# Patient Record
Sex: Male | Born: 1950 | Race: White | Hispanic: No | Marital: Married | Smoking: Former smoker
Health system: Southern US, Community
[De-identification: ages and names within clinical notes are randomized; demographics above are authoritative.]

## PROBLEM LIST (undated history)

## (undated) DIAGNOSIS — I469 Cardiac arrest, cause unspecified: Secondary | ICD-10-CM

## (undated) DIAGNOSIS — C439 Malignant melanoma of skin, unspecified: Secondary | ICD-10-CM

## (undated) DIAGNOSIS — E119 Type 2 diabetes mellitus without complications: Secondary | ICD-10-CM

## (undated) DIAGNOSIS — F32A Depression, unspecified: Secondary | ICD-10-CM

## (undated) DIAGNOSIS — I10 Essential (primary) hypertension: Secondary | ICD-10-CM

## (undated) DIAGNOSIS — E7800 Pure hypercholesterolemia, unspecified: Secondary | ICD-10-CM

## (undated) DIAGNOSIS — I2699 Other pulmonary embolism without acute cor pulmonale: Secondary | ICD-10-CM

## (undated) HISTORY — PX: BACK SURGERY: SHX140

## (undated) HISTORY — PX: KNEE SURGERY: SHX244

## (undated) HISTORY — PX: SHOULDER SURGERY: SHX246

## (undated) HISTORY — PX: PULMONARY EMBOLISM SURGERY: SHX752

---

## 2009-12-27 ENCOUNTER — Ambulatory Visit: Payer: Self-pay | Admitting: Internal Medicine

## 2009-12-27 ENCOUNTER — Inpatient Hospital Stay (HOSPITAL_COMMUNITY): Admission: EM | Admit: 2009-12-27 | Discharge: 2009-12-31 | Payer: Self-pay | Admitting: Emergency Medicine

## 2010-02-12 ENCOUNTER — Encounter (INDEPENDENT_AMBULATORY_CARE_PROVIDER_SITE_OTHER): Payer: Self-pay | Admitting: *Deleted

## 2010-02-12 ENCOUNTER — Encounter: Payer: Self-pay | Admitting: Internal Medicine

## 2010-03-17 ENCOUNTER — Encounter (INDEPENDENT_AMBULATORY_CARE_PROVIDER_SITE_OTHER): Payer: Self-pay | Admitting: *Deleted

## 2010-03-27 ENCOUNTER — Telehealth (INDEPENDENT_AMBULATORY_CARE_PROVIDER_SITE_OTHER): Payer: Self-pay | Admitting: *Deleted

## 2010-10-06 NOTE — Letter (Signed)
Summary: Device-Delinquent Check  [facility], Main Office  [address]   [place], [place] [address]   Phone: [phone]  Fax: [phone]     February 12, 2010 MRN: [id number]   ZIERE DOCKEN [address] [place], [place]  [address]   Dear Mr. PALLONE,  According to our records, you have not had your implanted device checked in the recommended period of time.  We are unable to determine appropriate device function without checking your device on a regular basis.  Please call our office to schedule an appointment with Dr. Ladona Ridgel as soon as possible.  If you are having your device checked by another physician, please call us so that we may update our records.  Thank you,  Altha Harm, LPN  February 13, 2724 8:42 AM  [facility]

## 2010-10-06 NOTE — Letter (Signed)
Summary: Device-Delinquent Check  [facility], Main Office  [address]   [place], [place] [address]   Phone: [phone]  Fax: [phone]     March 17, 2010 MRN: [id number]   SHAE AUGELLO [address] [place], [place]  [address]   Dear Mr. SHOCK,  According to our records, you have not had your implanted device checked in the recommended period of time.  We are unable to determine appropriate device function without checking your device on a regular basis.  Please call our office to schedule an appointment as soon as possible.  If you are having your device checked by another physician, please call us so that we may update our records.  Thank you,  Altha Harm, LPN  March 17, 2010 10:35 AM  [facility]  2nd notice

## 2010-10-06 NOTE — Progress Notes (Signed)
  Phone Note Call from Patient   Caller: Spouse Summary of Call: pt's wife calling re past due letter for pacemaker-pt is being followed by another cardiologist Initial call taken by: Glynda Jaeger,  March 27, 2010 10:59 AM

## 2010-10-06 NOTE — Miscellaneous (Signed)
Summary: Device preload  Clinical Lists Changes  Observations: Added new observation of ICD INDICATN: VR arrest (02/12/2010 14:58) Added new observation of ICDLEADSTAT1: active (02/12/2010 14:58) Added new observation of ICDLEADSER1: [id number] (02/12/2010 14:58) Added new observation of ICDLEADMOD1: 3875  (02/12/2010 14:58) Added new observation of ICDLEADDOI1: 12/30/2009  (02/12/2010 14:58) Added new observation of ICDLEADLOC1: RV  (02/12/2010 14:58) Added new observation of ICD IMP MD: Lewayne Bunting, MD  (02/12/2010 14:58) Added new observation of ICD IMPL DTE: 12/30/2009  (02/12/2010 14:58) Added new observation of ICD SERL#: [id number] H  (02/12/2010 14:58) Added new observation of ICD MODL#: [id number]  (02/12/2010 14:58) Added new observation of ICDMANUFACTR: [employer]  (02/12/2010 14:58) Added new observation of ICD MD: Lewayne Bunting, MD  (02/12/2010 14:58)       ICD Specifications Following MD:  Lewayne Bunting, MD     ICD Vendor:  [employer]     ICD Model Number:  D264VRC     ICD Serial Number:  [id number] ICD DOI:  12/30/2009     ICD Implanting MD:  Lewayne Bunting, MD  Lead 1:    Location: RV     DOI: 12/30/2009     Model #: 1601     Serial #: [id number]     Status: active  Indications::  VR arrest

## 2010-11-24 LAB — BASIC METABOLIC PANEL
BUN: 12 mg/dL (ref 6–23)
BUN: 6 mg/dL (ref 6–23)
BUN: 9 mg/dL (ref 6–23)
CO2: 25 mEq/L (ref 19–32)
Chloride: 107 mEq/L (ref 96–112)
Chloride: 108 mEq/L (ref 96–112)
Chloride: 110 mEq/L (ref 96–112)
Creatinine, Ser: 0.84 mg/dL (ref 0.4–1.5)
Creatinine, Ser: 0.85 mg/dL (ref 0.4–1.5)
GFR calc Af Amer: >60 mL/min (ref >60)
GFR calc Af Amer: >60 mL/min (ref >60)
GFR calc non Af Amer: >60 mL/min (ref >60)
Potassium: 3.8 mEq/L (ref 3.5–5.1)
Sodium: 139 mEq/L (ref 135–145)

## 2010-11-24 LAB — COMPREHENSIVE METABOLIC PANEL
ALT: 118 U/L — ABNORMAL HIGH (ref 0–53)
ALT: 241 U/L — ABNORMAL HIGH (ref 0–53)
AST: 272 U/L — ABNORMAL HIGH (ref 0–37)
AST: 58 U/L — ABNORMAL HIGH (ref 0–37)
Albumin: 3.4 g/dL — ABNORMAL LOW (ref 3.5–5.2)
Albumin: 4 g/dL (ref 3.5–5.2)
Alkaline Phosphatase: 47 U/L (ref 39–117)
BUN: 14 mg/dL (ref 6–23)
GFR calc non Af Amer: 53 mL/min — ABNORMAL LOW (ref >60)
Potassium: 3.9 mEq/L (ref 3.5–5.1)
Potassium: 4 mEq/L (ref 3.5–5.1)
Sodium: 139 mEq/L (ref 135–145)
Total Bilirubin: 0.8 mg/dL (ref 0.3–1.2)
Total Protein: 5.5 g/dL — ABNORMAL LOW (ref 6.0–8.3)

## 2010-11-24 LAB — GLUCOSE, CAPILLARY: Glucose-Capillary: 114 mg/dL — ABNORMAL HIGH (ref 70–99)

## 2010-11-24 LAB — CK TOTAL AND CKMB (NOT AT ARMC)
CK, MB: 17.8 ng/mL (ref 0.3–4.0)
Total CK: 1004 U/L — ABNORMAL HIGH (ref 7–232)

## 2010-11-24 LAB — DIFFERENTIAL
Basophils Absolute: 0 10*3/uL (ref 0.0–0.1)
Basophils Absolute: 0 10*3/uL (ref 0.0–0.1)
Basophils Absolute: 0.1 10*3/uL (ref 0.0–0.1)
Eosinophils Relative: 1 % (ref 0–5)
Eosinophils Relative: 2 % (ref 0–5)
Lymphocytes Relative: 21 % (ref 12–46)
Lymphocytes Relative: 26 % (ref 12–46)
Lymphs Abs: 1.6 10*3/uL (ref 0.7–4.0)
Lymphs Abs: 2.6 10*3/uL (ref 0.7–4.0)
Monocytes Absolute: 0.7 10*3/uL (ref 0.1–1.0)
Monocytes Relative: 8 % (ref 3–12)
Neutro Abs: 4.3 10*3/uL (ref 1.7–7.7)
Neutro Abs: 5 10*3/uL (ref 1.7–7.7)
Neutrophils Relative %: 68 % (ref 43–77)

## 2010-11-24 LAB — CBC
Hemoglobin: 13.3 g/dL (ref 13.0–17.0)
MCHC: 34.6 g/dL (ref 30.0–36.0)
MCHC: 35 g/dL (ref 30.0–36.0)
MCV: 94.8 fL (ref 78.0–100.0)
MCV: 96 fL (ref 78.0–100.0)
Platelets: 178 10*3/uL (ref 150–400)
Platelets: 199 10*3/uL (ref 150–400)
Platelets: 240 10*3/uL (ref 150–400)
RBC: 4.24 MIL/uL (ref 4.22–5.81)
RBC: 4.5 MIL/uL (ref 4.22–5.81)
RDW: 13.2 % (ref 11.5–15.5)
RDW: 13.3 % (ref 11.5–15.5)
RDW: 13.7 % (ref 11.5–15.5)
WBC: 6.9 10*3/uL (ref 4.0–10.5)
WBC: 7.4 10*3/uL (ref 4.0–10.5)
WBC: 8.6 10*3/uL (ref 4.0–10.5)

## 2010-11-24 LAB — HEPARIN LEVEL (UNFRACTIONATED)
Heparin Unfractionated: 0.13 IU/mL — ABNORMAL LOW (ref 0.30–0.70)
Heparin Unfractionated: 0.21 IU/mL — ABNORMAL LOW (ref 0.30–0.70)
Heparin Unfractionated: 0.23 IU/mL — ABNORMAL LOW (ref 0.30–0.70)
Heparin Unfractionated: 0.64 IU/mL (ref 0.30–0.70)

## 2010-11-24 LAB — HEPATIC FUNCTION PANEL
AST: 122 U/L — ABNORMAL HIGH (ref 0–37)
Albumin: 3.4 g/dL — ABNORMAL LOW (ref 3.5–5.2)
Alkaline Phosphatase: 49 U/L (ref 39–117)
Total Protein: 5.6 g/dL — ABNORMAL LOW (ref 6.0–8.3)

## 2010-11-24 LAB — MRSA PCR SCREENING: MRSA by PCR: NEGATIVE

## 2010-11-24 LAB — APTT: aPTT: 23 seconds — ABNORMAL LOW (ref 24–37)

## 2010-11-24 LAB — TYPE AND SCREEN: ABO/RH(D): AB POS

## 2010-11-24 LAB — RAPID URINE DRUG SCREEN, HOSP PERFORMED
Amphetamines: NOT DETECTED
Barbiturates: NOT DETECTED
Cocaine: NOT DETECTED
Tetrahydrocannabinol: NOT DETECTED

## 2010-11-24 LAB — POCT I-STAT, CHEM 8
Calcium, Ion: 1.02 mmol/L — ABNORMAL LOW (ref 1.12–1.32)
Creatinine, Ser: 1.3 mg/dL (ref 0.4–1.5)
Glucose, Bld: 185 mg/dL — ABNORMAL HIGH (ref 70–99)
Potassium: 5.7 mEq/L — ABNORMAL HIGH (ref 3.5–5.1)
Sodium: 137 mEq/L (ref 135–145)

## 2010-11-24 LAB — CARDIAC PANEL(CRET KIN+CKTOT+MB+TROPI)
Relative Index: 1 (ref 0.0–2.5)
Relative Index: 1.1 (ref 0.0–2.5)
Total CK: 1216 U/L — ABNORMAL HIGH (ref 7–232)
Total CK: 1559 U/L — ABNORMAL HIGH (ref 7–232)
Troponin I: 0.17 ng/mL — ABNORMAL HIGH (ref 0.00–0.06)
Troponin I: 0.32 ng/mL — ABNORMAL HIGH (ref 0.00–0.06)

## 2010-11-24 LAB — POCT CARDIAC MARKERS
Myoglobin, poc: 308 ng/mL (ref 12–200)
Troponin i, poc: <0.05 ng/mL (ref 0.00–0.09)

## 2010-11-24 LAB — PROTIME-INR: Prothrombin Time: 18.9 seconds — ABNORMAL HIGH (ref 11.6–15.2)

## 2010-11-24 LAB — HEMOGLOBIN A1C: Mean Plasma Glucose: 117 mg/dL — ABNORMAL HIGH (ref <117)

## 2010-11-24 LAB — LIPID PANEL: HDL: 31 mg/dL — ABNORMAL LOW (ref >39)

## 2010-11-24 LAB — ABO/RH: ABO/RH(D): AB POS

## 2010-11-24 LAB — MAGNESIUM: Magnesium: 2.5 mg/dL (ref 1.5–2.5)

## 2010-11-24 LAB — TSH: TSH: 1.269 u[IU]/mL (ref 0.350–4.500)

## 2011-04-20 ENCOUNTER — Encounter: Payer: Self-pay | Admitting: *Deleted

## 2011-11-12 IMAGING — CT CT CERVICAL SPINE W/O CM
5 of 6 series · 14 of 33 positions shown, 16 images · non-contrast
Comparison: None

CT HEAD

CLINICAL DATA: Syncope and fall, with laceration to the occiput.

CT HEAD WITHOUT CONTRAST AND CT CERVICAL SPINE WITHOUT CONTRAST
TECHNIQUE: Multidetector CT imaging of the head and cervical spine
was performed following the standard protocol without intravenous
contrast.  Multiplanar CT image reconstructions of the cervical
spine were also generated.

[Series 5: c_spine 2.0 b31s detail · axial · 0.25mm/px · z∈[+1010,+1064]mm · 2 of 83 slices shown, 3 images]
[im 28/83  soft-tissue]
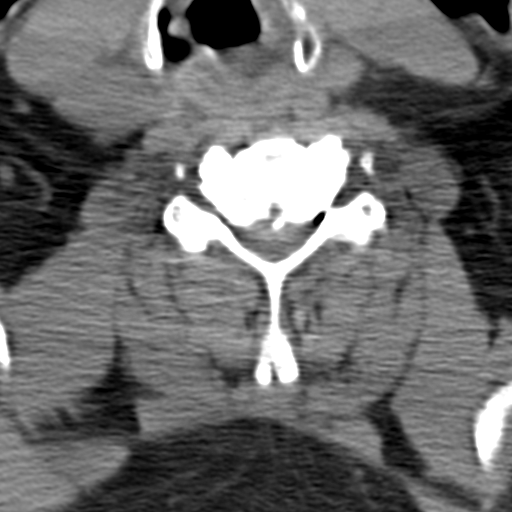
[im 28/83  bone]
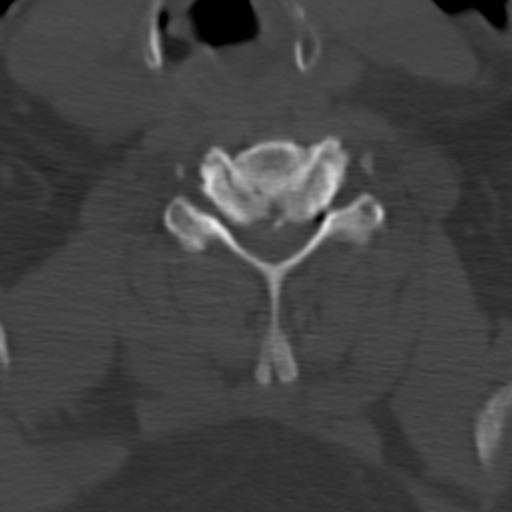
[im 55/83  bone]
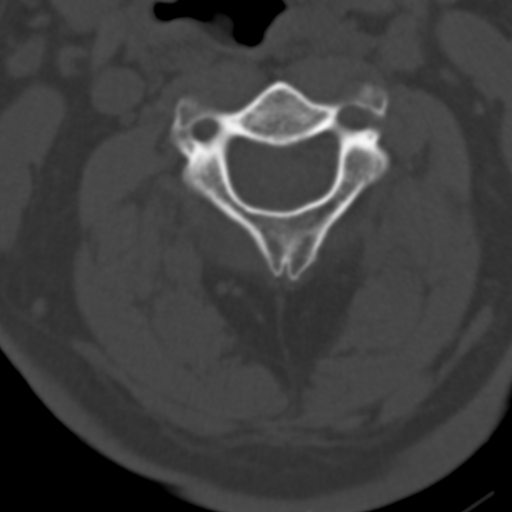

[Series 7: head trauma 2.4 h60s · axial · 0.41mm/px · z∈[+1178,+1237]mm · 2 of 72 slices shown]
[im 24/72  bone]
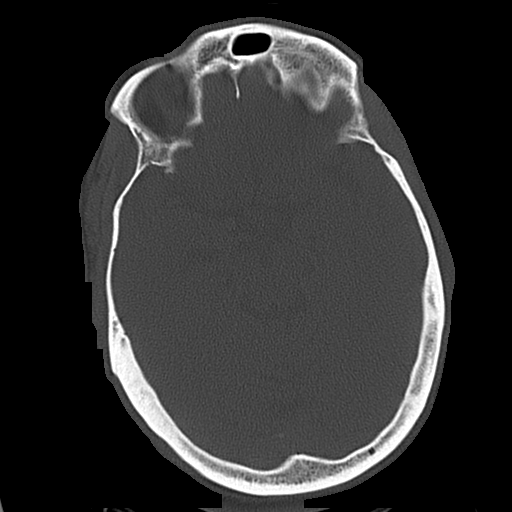
[im 48/72  bone]
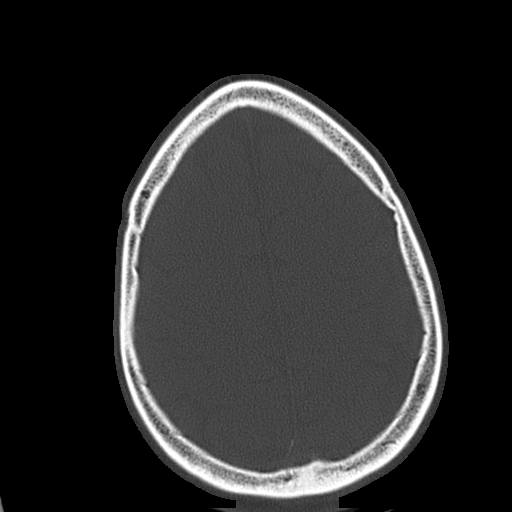

[Series 602: <mpr thick range> · coronal · 0.32mm/px · 3 of 38 slices shown]
[im 8/38  bone]
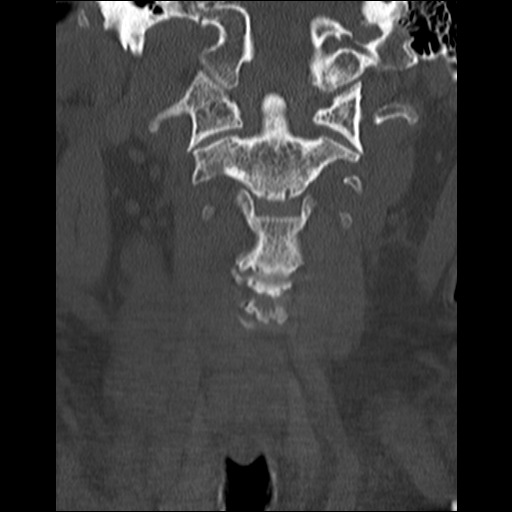
[im 15/38  bone]
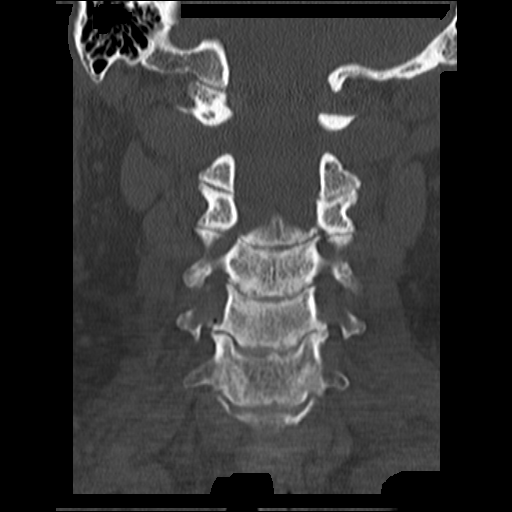
[im 23/38  bone]
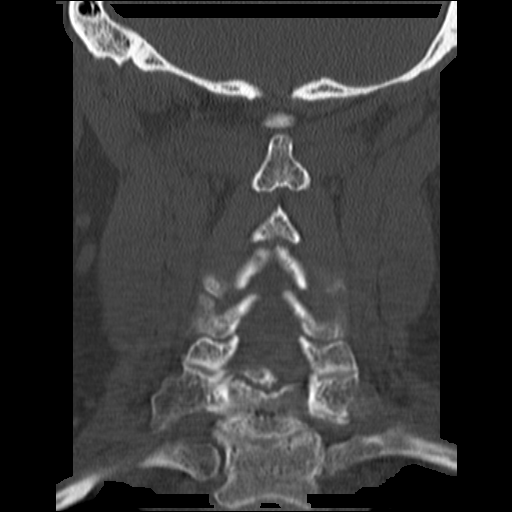

[Series 603: <mpr thick range(1)> · sagittal · 0.32mm/px · 5 of 38 slices shown, 6 images]
[im 13/38  bone]
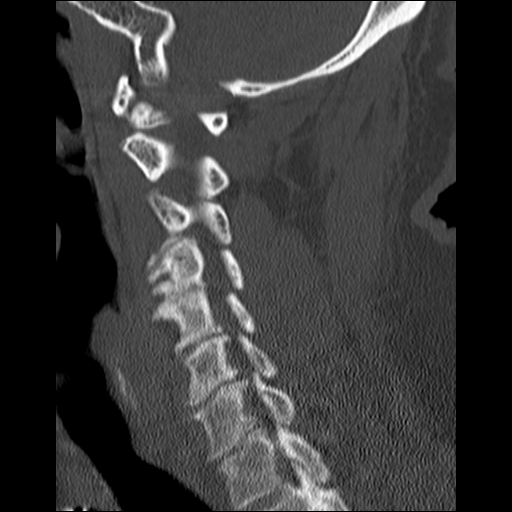
[im 16/38  bone]
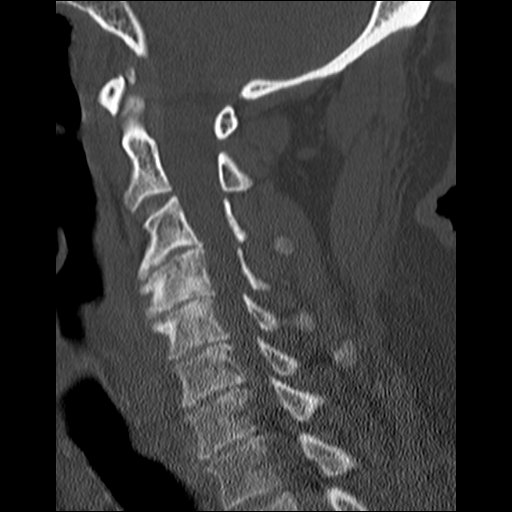
[im 19/38  soft-tissue]
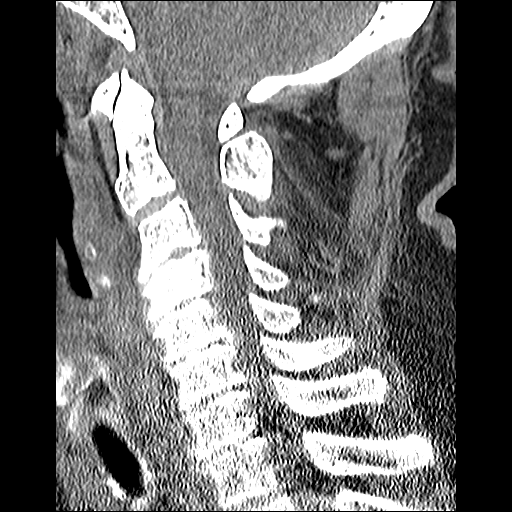
[im 19/38  bone]
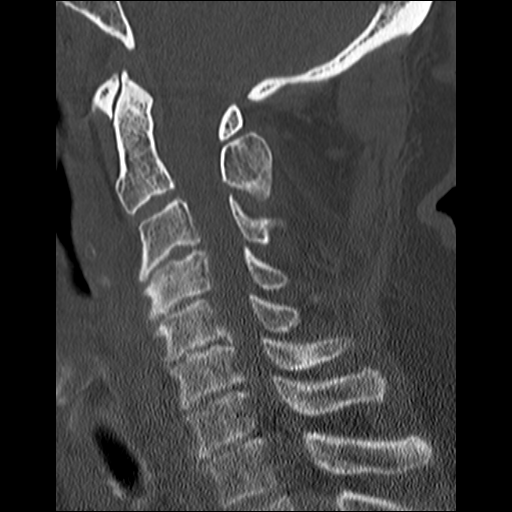
[im 22/38  bone]
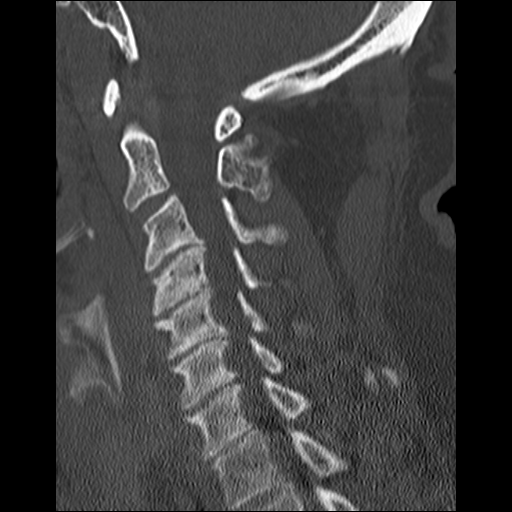
[im 25/38  bone]
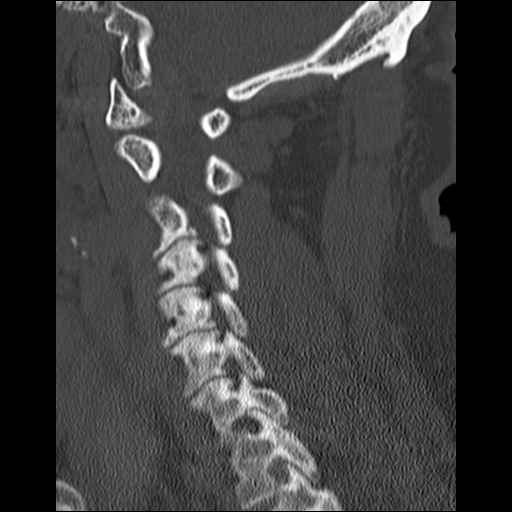

[Series 604: <mpr thick range(2)> · axial · 0.32mm/px · z∈[+990,+1032]mm · 2 of 68 slices shown]
[im 23/68  bone]
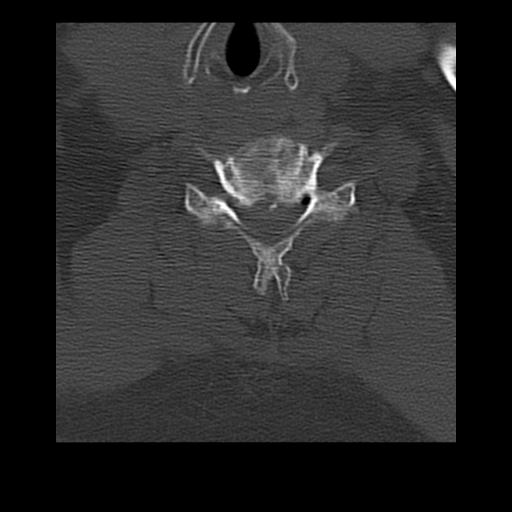
[im 45/68  bone]
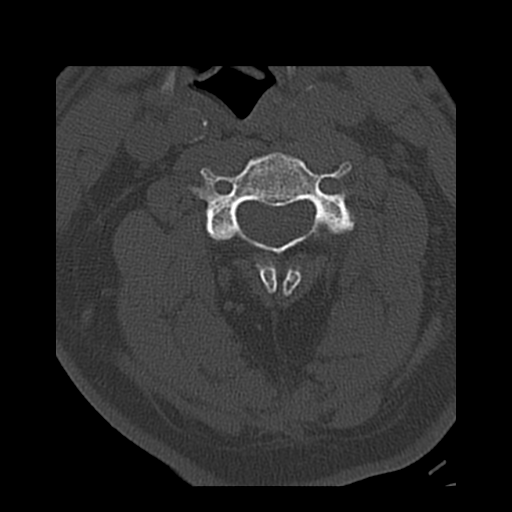

[14 of 33 positions shown; findings below may reference images not displayed]

FINDINGS: There is no evidence of acute infarction, mass lesion, or
intra- or extra-axial hemorrhage on CT.

Scattered subcortical white matter change may reflect small vessel
ischemic microangiopathy.

The posterior fossa, including the cerebellum, brainstem and fourth
ventricle, is within normal limits.  The third and lateral
ventricles, and basal ganglia are unremarkable in appearance.  The
cerebral hemispheres are symmetric in appearance, with normal gray-
white differentiation.  No mass effect or midline shift is seen.

There is no evidence of fracture; visualized osseous structures are
unremarkable in appearance.  The orbits are within normal limits.
There is partial opacification of both maxillary sinuses, left
greater than right, and mild partial opacification within the
ethmoid air cells.  The remaining paranasal sinuses and mastoid air
cells are well-aerated.  Mild soft tissue stranding is noted
overlying the posterior parietal calvarium.
IMPRESSION: 1.  No evidence of traumatic intracranial injury or fracture.
2.  Mild soft tissue stranding overlying the posterior parietal
calvarium.
3.  Scattered mild small vessel ischemic microangiopathy.
3.  Partial opacification of both maxillary sinuses, left greater
than right.

CT CERVICAL SPINE
FINDINGS: There is no evidence of fracture or subluxation.
Vertebral bodies demonstrate normal height and alignment.  There is
diffuse narrowing of the intervertebral disc spaces along the
cervical spine, with associated degenerative change and anterior
osteophyte formation.  Several small posterior disc osteophyte
complexes are also seen.  Prevertebral soft tissues are within
normal limits.

The thyroid gland is unremarkable in appearance.  The visualized
lung apices are clear.  No significant soft tissue abnormalities
are seen.
IMPRESSION: 1.  No evidence of fracture or subluxation along the cervical
spine.
2.  Degenerative change noted along the cervical spine.

## 2011-11-16 IMAGING — CR DG CHEST 2V
2 series · 2 of 2 positions shown · non-contrast
Comparison: 12/27/2009

CLINICAL DATA: Post AICD placement

CHEST - 2 VIEW

[w chest pa]
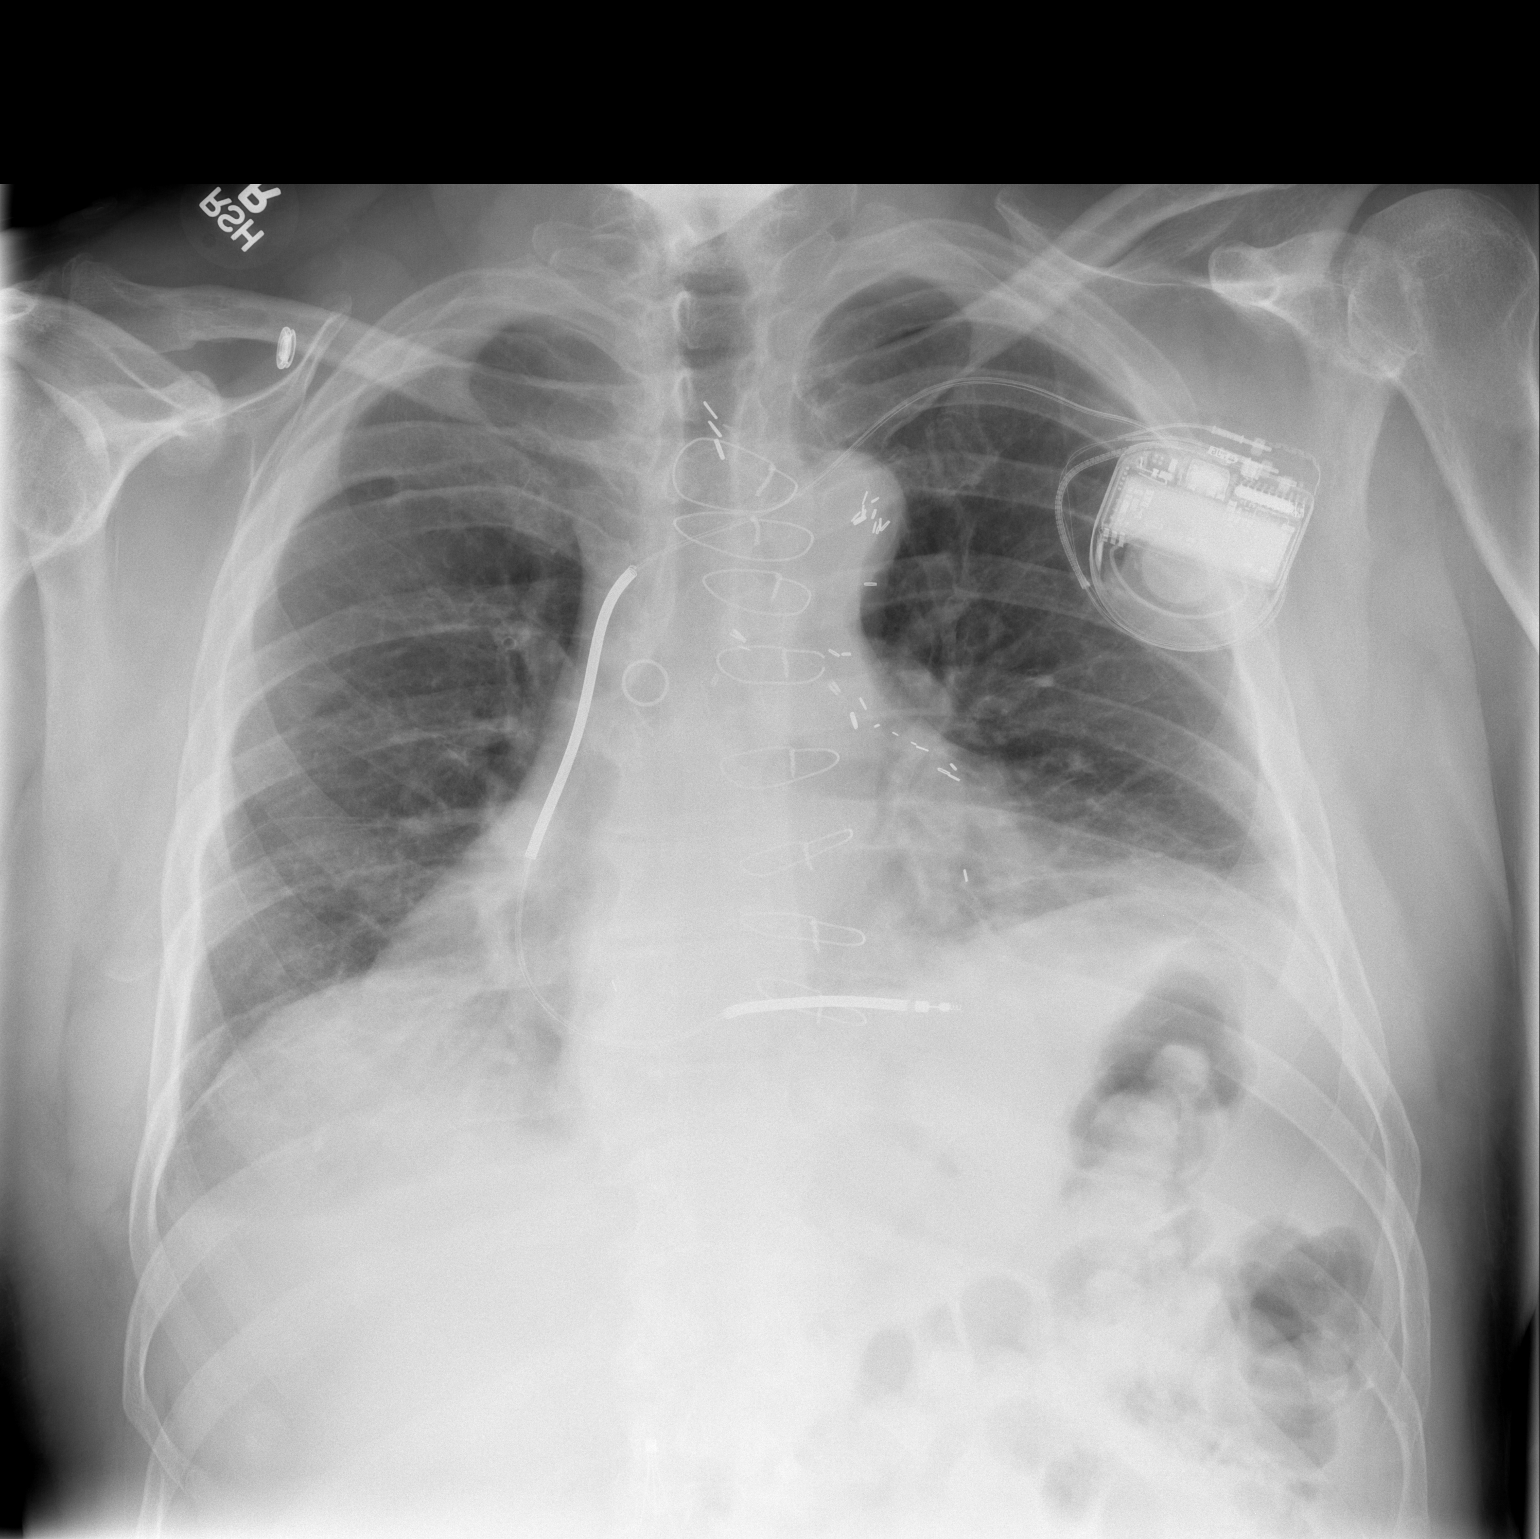

[w chest lat]
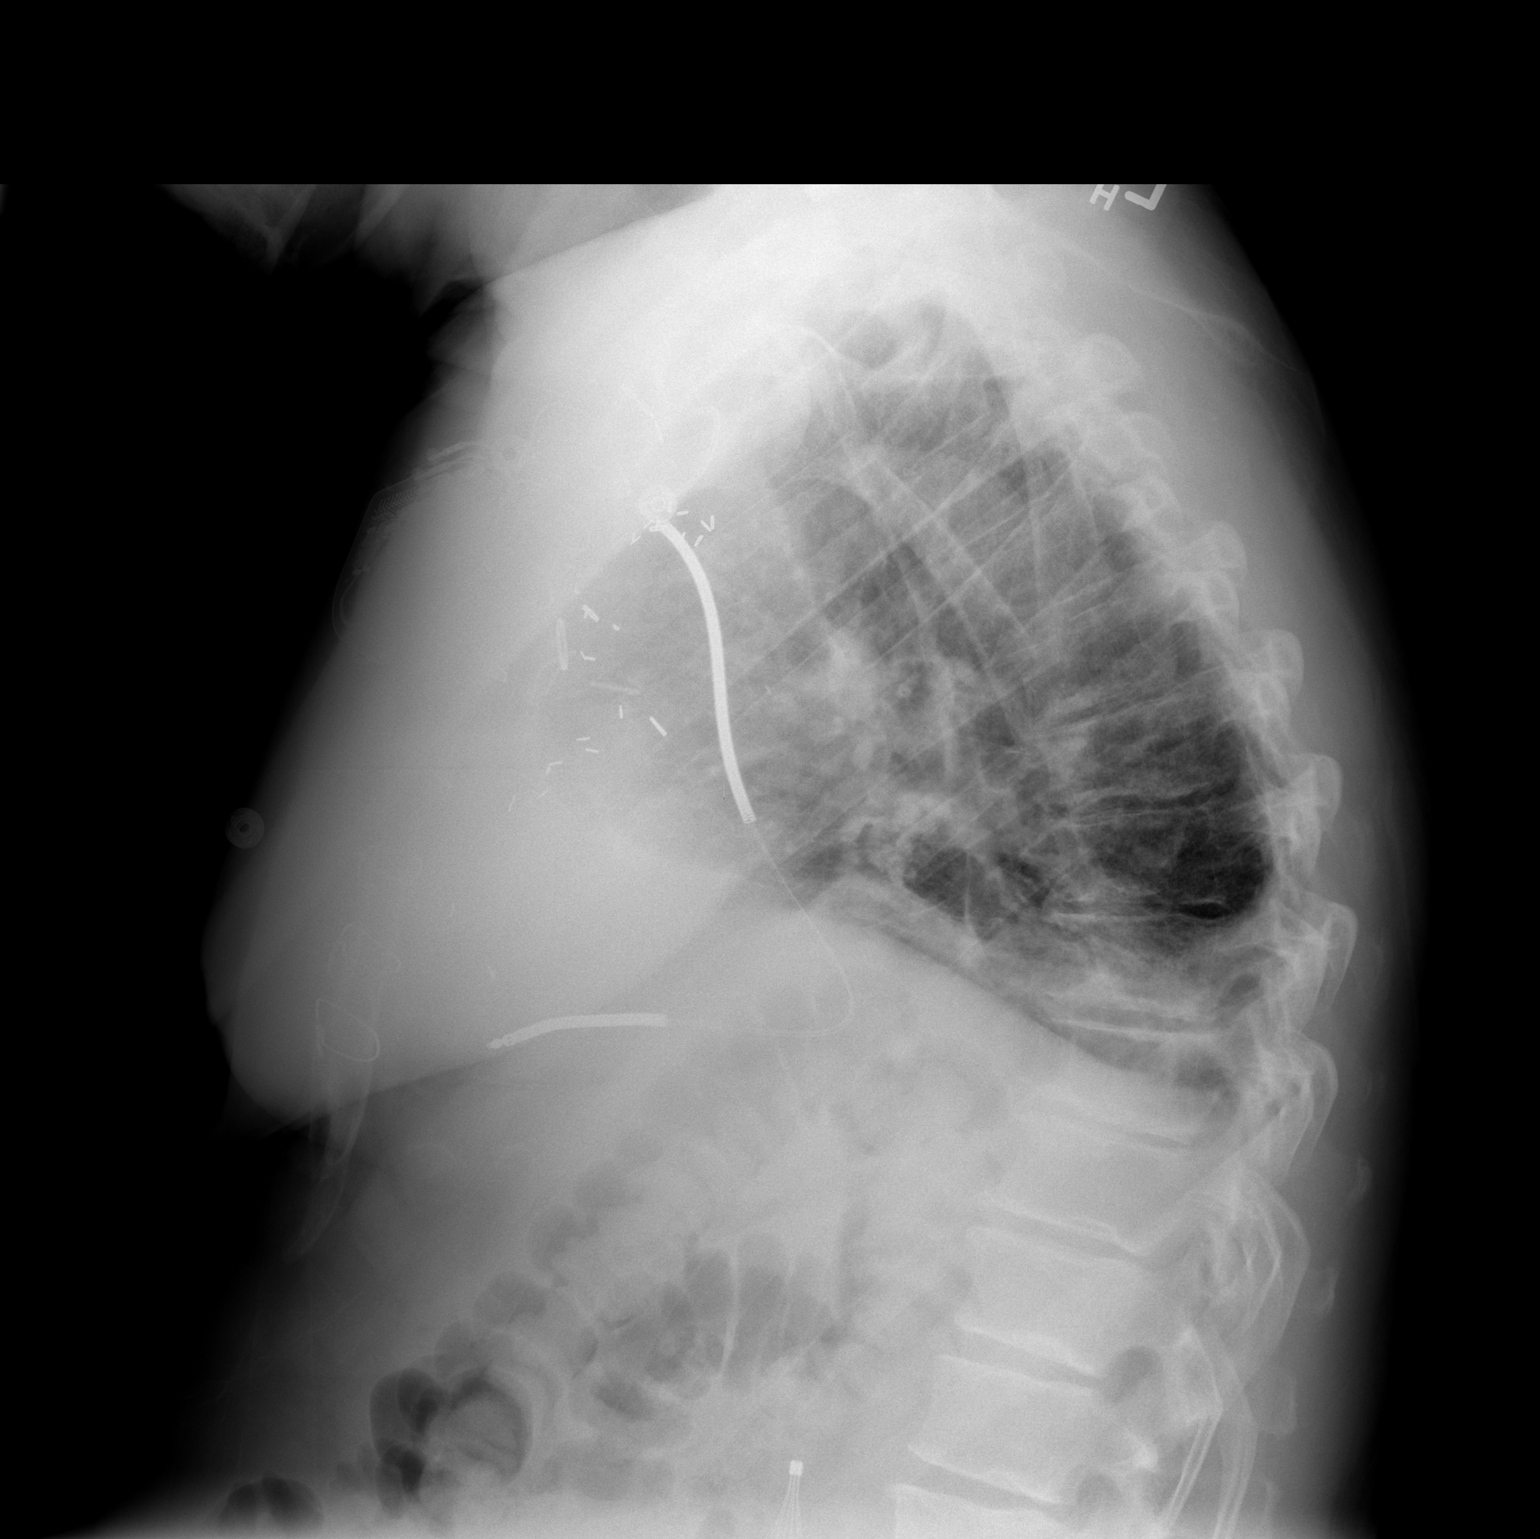

[2 of 2 positions shown; findings below may reference images not displayed]

FINDINGS: Post placement of a single lead AICD from the left
subclavian venous approach.  The tip is in the RV apex.  No
pneumothorax.  There is patchy left lower lobe atelectasis. Post
CABG.  Heart size within normal limits.  No heart failure.
IMPRESSION: 1.  Post AICD placement with no pneumothorax.
2.  Patchy left lower lobe atelectasis.

## 2022-10-16 ENCOUNTER — Ambulatory Visit
Admission: EM | Admit: 2022-10-16 | Discharge: 2022-10-16 | Disposition: A | Discharge status: Home / Self Care | Payer: Medicare Other

## 2022-10-16 DIAGNOSIS — R21 Rash and other nonspecific skin eruption: Secondary | ICD-10-CM

## 2022-10-16 MED ORDER — FEXOFENADINE HCL 180 MG PO TABS: 180.0000 mg | ORAL_TABLET | Freq: Every day | ORAL | 0 refills | Status: AC

## 2022-10-16 MED ORDER — PREDNISONE 10 MG (21) PO TBPK: ORAL_TABLET | Freq: Every day | ORAL | 0 refills | Status: AC

## 2022-10-16 MED ORDER — DEXAMETHASONE SODIUM PHOSPHATE 10 MG/ML IJ SOLN
10.0000 mg | Freq: Once | INTRAMUSCULAR | Status: AC
  Administered 2022-10-16: 10 mg via INTRAMUSCULAR

## 2022-10-16 NOTE — ED Triage Notes (Signed)
Chills, headache, rash/hives all over body that started last night. Putting on Zonalon cream last night but didn't help. Used a pressure washer yesterday outside and got dirt all over him, then 3 hours later started to have the rash/hives show up.

## 2022-10-16 NOTE — ED Provider Notes (Signed)
[facility]    CSN: [id number] Arrival date & time: 10/16/22  1021      History   Chief Complaint Chief Complaint  Patient presents with   Rash    HPI Gillis Holthusen is a 72 y.o. male.   HPI Pleasant 72 year old male presents with rash hives over body last night.  Reports using a pressure washer outside his house and got dirt and other debris on top of his skin then the rash/hive-like reaction occurred several hours later.  PMH significant for T2DM, history of DVT and long-term use of anticoagulant, (currently on Coumadin and denies any unusual bleeding), and history of melanoma.  History reviewed. No pertinent past medical history.  There are no problems to display for this patient.   History reviewed. No pertinent surgical history.     Home Medications    Prior to Admission medications   Medication Sig Start Date End Date Taking? Authorizing Provider  ALPRAZolam Duanne Moron) 0.5 MG tablet Take 0.5 mg by mouth at bedtime as needed.   Yes [provider]  atorvastatin (LIPITOR) 80 MG tablet Take by mouth. 04/22/20  Yes [provider]  betamethasone dipropionate 0.05 % cream Apply topically. 02/02/22  Yes [provider]  buPROPion (WELLBUTRIN XL) 150 MG 24 hr tablet Take 150 mg by mouth daily. 10/06/22  Yes [provider]  divalproex (DEPAKOTE ER) 500 MG 24 hr tablet Take 500 mg by mouth daily.   Yes [provider]  enalapril (VASOTEC) 2.5 MG tablet Take 1 tablet by mouth daily. 08/27/13  Yes [provider]  fexofenadine (ALLEGRA ALLERGY) 180 MG tablet Take 1 tablet (180 mg total) by mouth daily for 15 days. 10/16/22 10/31/22 Yes Eliezer Lofts, FNP  glucose blood (ONETOUCH VERIO) test strip Use as directed. Pharmacy, please dispense this brand of blood glucose test strips: OneTouch Verio Checks Blood sugars once daily 11/25/21  Yes [provider]  lithium carbonate 300 MG capsule Take by mouth.   Yes  [provider]  losartan (COZAAR) 25 MG tablet Take by mouth. 04/22/20  Yes [provider]  metFORMIN (GLUCOPHAGE) 500 MG tablet TAKE ONE TABLET (521m total) TWICE DAILY WITH MEALS 07/12/22  Yes [provider]  metoprolol succinate (TOPROL-XL) 25 MG 24 hr tablet Take by mouth. 10/07/21  Yes [provider]  metoprolol succinate (TOPROL-XL) 50 MG 24 hr tablet Take by mouth.   Yes [provider]  predniSONE (STERAPRED UNI-PAK 21 TAB) 10 MG (21) TBPK tablet Take by mouth daily. Take 6 tabs by mouth daily  for 2 days, then 5 tabs for 2 days, then 4 tabs for 2 days, then 3 tabs for 2 days, 2 tabs for 2 days, then 1 tab by mouth daily for 2 days 10/16/22  Yes REliezer Lofts FNP  QUEtiapine (SEROQUEL) 400 MG tablet Take 400 mg by mouth at bedtime.   Yes [provider]  tiZANidine (ZANAFLEX) 4 MG tablet Take 1 tablet by mouth daily. 01/07/17  Yes [provider]  warfarin (COUMADIN) 2.5 MG tablet Alternate taking 5 mg and 7.5 mg every other day   Yes [provider]  warfarin (COUMADIN) 5 MG tablet Take half a tablet to two tablets daily as directed by your physician and refer to most recent anticoagulation encounter for current dosing. 11/05/16  Yes [provider]  nitroGLYCERIN (NITROSTAT) 0.4 MG SL tablet Place under the tongue. 08/28/18   [provider]    Family History History reviewed. No  pertinent family history.  Social History Social History   Tobacco Use   Smoking status: Never   Smokeless tobacco: Never  Substance Use Topics   Alcohol use: Never   Drug use: Never     Allergies   Patient has no allergy information on record.   Review of Systems Review of Systems  Skin:  Positive for rash.  All other systems reviewed and are negative.    Physical Exam Triage Vital Signs ED Triage Vitals  Enc Vitals Group     BP 10/16/22 1040 (!) 145/70     Pulse Rate 10/16/22 1040 80     Resp  10/16/22 1040 18     Temp 10/16/22 1040 98.2 F (36.8 C)     Temp Source 10/16/22 1040 Oral     SpO2 10/16/22 1040 98 %     Weight --      Height --      Head Circumference --      Peak Flow --      Pain Score 10/16/22 1041 9     Pain Loc --      Pain Edu? --      Excl. in [place]? --    No data found.  Updated Vital Signs BP (!) 145/70 (BP Location: Right Arm)   Pulse 80   Temp 98.2 F (36.8 C) (Oral)   Resp 18   SpO2 98%      Physical Exam Vitals and nursing note reviewed.  Constitutional:      Appearance: Normal appearance. He is normal weight.  HENT:     Head: Normocephalic and atraumatic.     Mouth/Throat:     Mouth: Mucous membranes are moist.     Pharynx: Oropharynx is clear.  Eyes:     Extraocular Movements: Extraocular movements intact.     Conjunctiva/sclera: Conjunctivae normal.     Pupils: Pupils are equal, round, and reactive to light.  Cardiovascular:     Rate and Rhythm: Normal rate and regular rhythm.     Pulses: Normal pulses.     Heart sounds: Normal heart sounds.  Pulmonary:     Effort: Pulmonary effort is normal.     Breath sounds: Normal breath sounds. No wheezing, rhonchi or rales.  Musculoskeletal:     Cervical back: Normal range of motion and neck supple.  Skin:    General: Skin is warm and dry.     Comments: Chest/abdomen/torso/upper/lower arms/upper/lower back/upper legs bilaterally: Erythematous pruritic maculopapular eruption, with some annular borders resembling hive-like eruption as well  Neurological:     General: No focal deficit present.     Mental Status: He is alert and oriented to person, place, and time. Mental status is at baseline.      UC Treatments / Results  Labs (all labs ordered are listed, but only abnormal results are displayed) Labs Reviewed - No data to display  EKG   Radiology No results found.  Procedures Procedures (including critical care time)  Medications Ordered in UC Medications  dexamethasone  (DECADRON) injection 10 mg (10 mg Intramuscular Given 10/16/22 1126)    Initial Impression / Assessment and Plan / UC Course  I have reviewed the triage vital signs and the nursing notes.  Pertinent labs & imaging results that were available during my care of the patient were reviewed by me and considered in my medical decision making (see chart for details).    MDM: 1.  Rash and nonspecific skin eruption-IM Decadron 10 mg given  once in clinic, Rx'd Sterapred [place], [place]. Instructed patient to take medications as directed with food to completion.  Advised patient to start Sterapred [place] today at 230, continuing tomorrow morning for the next 9 days.  Encourage patient to increase daily water intake to 64 ounces per day while taking these medications.  Advised patient to change bed linens for the next 2 days to avoid recontamination.  Advised patient if symptoms worsen and/or unresolved please follow-up with PCP or here for further evaluation.  Patient discharged home, hemodynamically stable. Final Clinical Impressions(s) / UC Diagnoses   Final diagnoses:  Rash and nonspecific skin eruption     Discharge Instructions      Instructed patient to take medications as directed with food to completion.  Advised patient to start Sterapred [place] today at 230, continuing tomorrow morning for the next 9 days.  Encourage patient to increase daily water intake to 64 ounces per day while taking these medications.  Advised patient to change bed linens for the next 2 days to avoid recontamination.  Advised patient if symptoms worsen and/or unresolved please follow-up with PCP or here for further evaluation.     ED Prescriptions     Medication Sig Dispense Auth. Provider   fexofenadine (ALLEGRA ALLERGY) 180 MG tablet Take 1 tablet (180 mg total) by mouth daily for 15 days. 15 tablet Eliezer Lofts, FNP   predniSONE (STERAPRED UNI-PAK 21 TAB) 10 MG (21) TBPK tablet Take by  mouth daily. Take 6 tabs by mouth daily  for 2 days, then 5 tabs for 2 days, then 4 tabs for 2 days, then 3 tabs for 2 days, 2 tabs for 2 days, then 1 tab by mouth daily for 2 days 42 tablet Eliezer Lofts, FNP      PDMP not reviewed this encounter.   Eliezer Lofts, [place] 10/16/22 1153

## 2022-10-16 NOTE — Discharge Instructions (Addendum)
Instructed patient to take medications as directed with food to completion.  Advised patient to start Sterapred [place] today at 230, continuing tomorrow morning for the next 9 days.  Encourage patient to increase daily water intake to 64 ounces per day while taking these medications.  Advised patient to change bed linens for the next 2 days to avoid recontamination.  Advised patient if symptoms worsen and/or unresolved please follow-up with PCP or here for further evaluation.

## 2022-10-17 ENCOUNTER — Ambulatory Visit
Admission: EM | Admit: 2022-10-17 | Discharge: 2022-10-17 | Disposition: A | Discharge status: Home / Self Care | Payer: Medicare Other | Attending: Family Medicine | Admitting: Family Medicine

## 2022-10-17 ENCOUNTER — Encounter: Payer: Self-pay | Admitting: *Deleted

## 2022-10-17 DIAGNOSIS — R21 Rash and other nonspecific skin eruption: Secondary | ICD-10-CM

## 2022-10-17 HISTORY — DX: Cardiac arrest, cause unspecified: I46.9

## 2022-10-17 HISTORY — DX: Depression, unspecified: F32.A

## 2022-10-17 HISTORY — DX: Other pulmonary embolism without acute cor pulmonale: I26.99

## 2022-10-17 HISTORY — DX: Type 2 diabetes mellitus without complications: E11.9

## 2022-10-17 HISTORY — DX: Pure hypercholesterolemia, unspecified: E78.00

## 2022-10-17 HISTORY — DX: Essential (primary) hypertension: I10

## 2022-10-17 HISTORY — DX: Malignant melanoma of skin, unspecified: C43.9

## 2022-10-17 LAB — POCT FASTING CBG KUC MANUAL ENTRY: POCT Glucose (KUC): 208 mg/dL — AB (ref 70–99)

## 2022-10-17 NOTE — ED Provider Notes (Signed)
[facility]    CSN: [id number] Arrival date & time: 10/17/22  0855      History   Chief Complaint Chief Complaint  Patient presents with   Rash    HPI Tahj Ewoldt is a 72 y.o. male.   HPI Pleasant 72 year old male presents for follow-up of continuing rash.  Patient was evaluated here yesterday, 10/16/2022; however, reports still having severe itching with the rash.  PMH significant for T2DM, recurrentDVT, history of melanoma, and ischemic cardiomyopathy.  Patient is currently on Coumadin and denies any unusual bleeding.  Patient's CBG at triage was 208.  Patient reports taking Sterapred Un[place] and Fexofenadine as directed and did eat breakfast this morning with this medication.  Past Medical History:  Diagnosis Date   Cardiac arrest ([place])    Depression    Hypercholesteremia    Hypertension    Melanoma ([place])    Pulmonary emboli ([facility])    Type II diabetes mellitus ([place])     There are no problems to display for this patient.   Past Surgical History:  Procedure Laterality Date   BACK SURGERY     KNEE SURGERY     x4   PULMONARY EMBOLISM SURGERY     SHOULDER SURGERY         Home Medications    Prior to Admission medications   Medication Sig Start Date End Date Taking? Authorizing Provider  ALPRAZolam Duanne Moron) 0.5 MG tablet Take 0.5 mg by mouth at bedtime as needed.   Yes [provider]  atorvastatin (LIPITOR) 80 MG tablet Take by mouth. 04/22/20  Yes [provider]  buPROPion (WELLBUTRIN XL) 150 MG 24 hr tablet Take 150 mg by mouth daily. 10/06/22  Yes [provider]  divalproex (DEPAKOTE ER) 500 MG 24 hr tablet Take 500 mg by mouth daily.   Yes [provider]  enalapril (VASOTEC) 2.5 MG tablet Take 1 tablet by mouth daily. 08/27/13  Yes [provider]  fexofenadine (ALLEGRA ALLERGY) 180 MG tablet Take 1 tablet (180 mg total) by mouth daily for 15 days. 10/16/22 10/31/22 Yes Eliezer Lofts, FNP  lithium  carbonate 300 MG capsule Take by mouth.   Yes [provider]  losartan (COZAAR) 25 MG tablet Take by mouth. 04/22/20  Yes [provider]  metFORMIN (GLUCOPHAGE) 500 MG tablet TAKE ONE TABLET (537m total) TWICE DAILY WITH MEALS 07/12/22  Yes [provider]  metoprolol succinate (TOPROL-XL) 25 MG 24 hr tablet Take by mouth. 10/07/21  Yes [provider]  predniSONE (STERAPRED UNI-PAK 21 TAB) 10 MG (21) TBPK tablet Take by mouth daily. Take 6 tabs by mouth daily  for 2 days, then 5 tabs for 2 days, then 4 tabs for 2 days, then 3 tabs for 2 days, 2 tabs for 2 days, then 1 tab by mouth daily for 2 days 10/16/22  Yes REliezer Lofts FNP  QUEtiapine (SEROQUEL) 400 MG tablet Take 400 mg by mouth at bedtime.   Yes [provider]  warfarin (COUMADIN) 2.5 MG tablet Alternate taking 5 mg and 7.5 mg every other day   Yes [provider]  warfarin (COUMADIN) 5 MG tablet Take half a tablet to two tablets daily as directed by your physician and refer to most recent anticoagulation encounter for current dosing. 11/05/16  Yes [provider]  betamethasone dipropionate 0.05 % cream Apply topically. 02/02/22   [provider]  glucose blood (ONETOUCH VERIO) test strip Use as directed. Pharmacy, please dispense this brand  of blood glucose test strips: OneTouch Verio Checks Blood sugars once daily 11/25/21   [provider]  metoprolol succinate (TOPROL-XL) 50 MG 24 hr tablet Take by mouth.    [provider]  nitroGLYCERIN (NITROSTAT) 0.4 MG SL tablet Place under the tongue. 08/28/18   [provider]  tiZANidine (ZANAFLEX) 4 MG tablet Take 1 tablet by mouth daily. 01/07/17   [provider]    Family History History reviewed. No pertinent family history.  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   Review of Systems Review of Systems  Skin:  Positive for rash.  All other systems reviewed and are negative.    Physical Exam Triage Vital Signs ED Triage Vitals  Enc Vitals Group     BP 10/17/22 0914 (!) 143/83     Pulse Rate 10/17/22 0914 78     Resp 10/17/22 0914 18     Temp --      Temp src --      SpO2 10/17/22 0914 94 %     Weight --      Height --      Head Circumference --      Peak Flow --      Pain Score 10/17/22 0916 0     Pain Loc --      Pain Edu? --      Excl. in [place]? --    No data found.  Updated Vital Signs BP (!) 143/83   Pulse 78   Resp 18   SpO2 94%      Physical Exam Vitals and nursing note reviewed.  Constitutional:      Appearance: Normal appearance. He is normal weight. He is not ill-appearing.  HENT:     Head: Normocephalic and atraumatic.     Mouth/Throat:     Mouth: Mucous membranes are moist.     Pharynx: Oropharynx is clear.  Eyes:     Extraocular Movements: Extraocular movements intact.     Conjunctiva/sclera: Conjunctivae normal.     Pupils: Pupils are equal, round, and reactive to light.  Cardiovascular:     Rate and Rhythm: Normal rate and regular rhythm.     Pulses: Normal pulses.     Heart sounds: Normal heart sounds. No murmur heard. Pulmonary:     Effort: Pulmonary effort is normal.     Breath sounds: Normal breath sounds. No wheezing, rhonchi or rales.  Musculoskeletal:        General: Normal range of motion.     Cervical back: Normal range of motion and neck supple.  Lymphadenopathy:     Cervical: No cervical adenopathy.  Skin:    General: Skin is warm and dry.     Comments: Chest/bilateral axilla: Well improved (as compared with yesterday, 10/16/2022 initial  appearing rash) mildly erythematous pruritic maculopapular eruption noted-please see images below  Neurological:     General: No focal deficit present.     Mental Status: He is alert. Mental status is at baseline.  UC Treatments / Results  Labs (all labs ordered are listed, but only abnormal results are displayed) Labs Reviewed  POCT FASTING CBG [facility] MANUAL ENTRY - Abnormal; Notable for the following components:      Result Value   POCT Glucose ([facility]) 208 (*)    All other components within normal limits    EKG   Radiology No results found.  Procedures Procedures (including critical care time)  Medications Ordered in UC Medications - No data to display  Initial Impression / Assessment and Plan / UC Course  I have reviewed the triage vital signs and the nursing notes.  Pertinent labs & imaging results that were available during my care of the patient were reviewed by me and considered in my medical decision making (see chart for details).     MDM: 1. Rash and nonspecific skin eruption-CBG 208 during triage today, patient advised that steroids will increase current blood glucose readings and he should remain at previously prescribed dosing.  Patient agreed and verbalized understanding of these instructions and plan of care this morning.  Patient discharged home, hemodynamically stable.  Final Clinical Impressions(s) / UC Diagnoses   Final diagnoses:  Rash and nonspecific skin eruption     Discharge Instructions      Advised patient to complete medications as previously prescribed.  Encouraged patient to increase daily water intake while taking these medications.  Advised if symptoms worsen and/or unresolved please follow-up with PCP, dermatology, or here for further evaluation.     ED Prescriptions   None    PDMP not reviewed this encounter.   Eliezer Lofts, [place] 10/17/22 1036

## 2022-10-17 NOTE — ED Triage Notes (Signed)
Pt was seen 10/16/22; states he feels rash is improving, but c/o severe pruritus to rash. States taking the fexofenadine and prednisone as prescribed.

## 2022-10-17 NOTE — Discharge Instructions (Addendum)
Advised patient to complete medications as previously prescribed.  Encouraged patient to increase daily water intake while taking these medications.  Advised if symptoms worsen and/or unresolved please follow-up with PCP, dermatology, or here for further evaluation.
